# Patient Record
Sex: Female | Born: 2000
Health system: Southern US, Community
[De-identification: ages and names within clinical notes are randomized; demographics above are authoritative.]

---
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
## 2021-10-17 ENCOUNTER — Ambulatory Visit
Admission: EM | Admit: 2021-10-17 | Discharge: 2021-10-17 | Disposition: A | Discharge status: Home / Self Care | Payer: Medicaid Other | Attending: Family Medicine | Admitting: Family Medicine

## 2021-10-17 ENCOUNTER — Encounter: Payer: Self-pay | Admitting: Emergency Medicine

## 2021-10-17 DIAGNOSIS — Z20822 Contact with and (suspected) exposure to covid-19: Secondary | ICD-10-CM | POA: Insufficient documentation

## 2021-10-17 DIAGNOSIS — B349 Viral infection, unspecified: Secondary | ICD-10-CM | POA: Insufficient documentation

## 2021-10-17 DIAGNOSIS — R509 Fever, unspecified: Secondary | ICD-10-CM | POA: Diagnosis present

## 2021-10-17 LAB — POCT RAPID STREP A (OFFICE): Rapid Strep A Screen: NEGATIVE

## 2021-10-17 LAB — RESP PANEL BY RT-PCR (FLU A&B, COVID) ARPGX2
Influenza A by PCR: NEGATIVE
Influenza B by PCR: NEGATIVE
SARS Coronavirus 2 by RT PCR: NEGATIVE

## 2021-10-17 MED ORDER — ACETAMINOPHEN 325 MG PO TABS
650.0000 mg | ORAL_TABLET | Freq: Once | ORAL | Status: AC
  Administered 2021-10-17: 650 mg via ORAL

## 2021-10-17 NOTE — Discharge Instructions (Addendum)
Advised patient may take OTC Tylenol 1000 mg 1-3 times daily for fever.  Advised patient we will follow-up later this afternoon or first thing tomorrow morning with COVID-19/influenza results.  Advised patient if symptoms worsen and/or unresolved please follow-up with PCP or here for further evaluation.

## 2021-10-17 NOTE — ED Triage Notes (Signed)
Headache started on Monday at work  Nasal congestion  Denies fever Tylenol cold & flu  last night  - no relief  No home COVID test  Last COVID vaccine 2/23

## 2021-10-17 NOTE — ED Provider Notes (Signed)
[facility]    CSN: [id number] Arrival date & time: 10/17/21  1146      History   Chief Complaint Chief Complaint  Patient presents with   Sore Throat    HPI Jillian Jones is a 21 y.o. female.   HPI 21 year old female presents with fever, headache, nasal congestion, and sore throat for 2 days.  History reviewed. No pertinent past medical history.  There are no problems to display for this patient.   History reviewed. No pertinent surgical history.  OB History   No obstetric history on file.      Home Medications    Prior to Admission medications   Medication Sig Start Date End Date Taking? Authorizing Provider  fluticasone (FLONASE) 50 MCG/ACT nasal spray Place 2 sprays into both nostrils daily. Patient not taking: Reported on 10/17/2021 08/31/19   Palumbo, April, MD  naproxen (NAPROSYN) 375 MG tablet Take 1 tablet (375 mg total) by mouth 2 (two) times daily. Patient not taking: Reported on 10/17/2021 01/16/18   Petrucelli, Samantha R, PA-C  ondansetron (ZOFRAN ODT) 4 MG disintegrating tablet Take 1 tablet (4 mg total) by mouth every 8 (eight) hours as needed for up to 5 doses for nausea or vomiting. Patient not taking: Reported on 10/17/2021 10/28/20   Gloris Manchester, MD  promethazine (PHENERGAN) 12.5 MG tablet Take 1 tablet (12.5 mg total) by mouth every 6 (six) hours as needed for nausea or vomiting. Patient not taking: Reported on 10/17/2021 01/16/18   Petrucelli, Pleas Koch, PA-C    Family History Family History  Adopted: Yes  Family history unknown: Yes    Social History Social History   Tobacco Use   Smoking status: Never   Smokeless tobacco: Never  Vaping Use   Vaping Use: Never used  Substance Use Topics   Alcohol use: Never   Drug use: Never     Allergies   Patient has no known allergies.   Review of Systems Review of Systems  Constitutional:  Positive for fever.  HENT:  Positive for congestion and sore throat.   Neurological:   Positive for headaches.  All other systems reviewed and are negative.    Physical Exam Triage Vital Signs ED Triage Vitals  Enc Vitals Group     BP 10/17/21 1159 95/61     Pulse Rate 10/17/21 1159 66     Resp 10/17/21 1159 16     Temp 10/17/21 1159 100.2 F (37.9 C)     Temp Source 10/17/21 1159 Oral     SpO2 10/17/21 1159 98 %     Weight --      Height 10/17/21 1202 5\' 4"  (1.626 m)     Head Circumference --      Peak Flow --      Pain Score 10/17/21 1201 5     Pain Loc --      Pain Edu? --      Excl. in GC? --    No data found.  Updated Vital Signs BP 95/61 (BP Location: Left Arm)   Pulse 66   Temp 100.2 F (37.9 C) (Oral)   Resp 16   Ht 5\' 4"  (1.626 m)   LMP 09/30/2021 (Exact Date)   SpO2 98%   BMI 22.66 kg/m    Physical Exam Vitals and nursing note reviewed.  Constitutional:      Appearance: Normal appearance. She is well-developed and normal weight. She is ill-appearing.  HENT:     Head: Normocephalic and atraumatic.  Right Ear: Tympanic membrane, ear canal and external ear normal.     Left Ear: Tympanic membrane, ear canal and external ear normal.     Mouth/Throat:     Mouth: Mucous membranes are moist.     Pharynx: Oropharynx is clear. Uvula midline.  Eyes:     Conjunctiva/sclera: Conjunctivae normal.     Pupils: Pupils are equal, round, and reactive to light.  Cardiovascular:     Rate and Rhythm: Normal rate and regular rhythm.     Pulses: Normal pulses.     Heart sounds: Normal heart sounds. No murmur heard. Pulmonary:     Effort: Pulmonary effort is normal.     Breath sounds: Normal breath sounds. No wheezing, rhonchi or rales.  Musculoskeletal:     Cervical back: Normal range of motion and neck supple. No tenderness.  Lymphadenopathy:     Cervical: No cervical adenopathy.  Skin:    General: Skin is warm and dry.  Neurological:     General: No focal deficit present.     Mental Status: She is alert and oriented to person, place, and  time.      UC Treatments / Results  Labs (all labs ordered are listed, but only abnormal results are displayed) Labs Reviewed  RESP PANEL BY RT-PCR (FLU A&B, COVID) ARPGX2  CULTURE, GROUP A STREP [facility])  POCT RAPID STREP A (OFFICE)    EKG   Radiology No results found.  Procedures Procedures (including critical care time)  Medications Ordered in UC Medications  acetaminophen (TYLENOL) tablet 650 mg (650 mg Oral Given 10/17/21 1216)    Initial Impression / Assessment and Plan / UC Course  I have reviewed the triage vital signs and the nursing notes.  Pertinent labs & imaging results that were available during my care of the patient were reviewed by me and considered in my medical decision making (see chart for details).     MDM: 1.  Fever-Tylenol 650 mg given once in clinic today; 2.  Viral illness-COVID-19/Influenza ordered. Advised patient may take OTC Tylenol 1000 mg 1-3 times daily for fever.  Advised patient we will follow-up later this afternoon or first thing tomorrow morning with COVID-19/influenza results.  Advised patient if symptoms worsen and/or unresolved please follow-up with PCP or here for further evaluation.  Work note provided to patient prior to discharge.  Patient discharged home, hemodynamically stable. Final Clinical Impressions(s) / UC Diagnoses   Final diagnoses:  Fever, unspecified  Viral illness     Discharge Instructions      Advised patient may take OTC Tylenol 1000 mg 1-3 times daily for fever.  Advised patient we will follow-up later this afternoon or first thing tomorrow morning with COVID-19/influenza results.  Advised patient if symptoms worsen and/or unresolved please follow-up with PCP or here for further evaluation.     ED Prescriptions   None    PDMP not reviewed this encounter.   Trevor Iha, FNP 10/17/21 1252

## 2021-10-18 ENCOUNTER — Telehealth: Payer: Self-pay | Admitting: Emergency Medicine

## 2021-10-18 NOTE — Telephone Encounter (Signed)
Spoke with patient states that her throat isn't bothering her anymore but she is still congested.  Will follow up as needed.

## 2021-10-21 LAB — CULTURE, GROUP A STREP (THRC)

## 2022-01-25 ENCOUNTER — Ambulatory Visit
Admission: EM | Admit: 2022-01-25 | Discharge: 2022-01-25 | Disposition: A | Discharge status: Home / Self Care | Payer: Medicaid Other | Attending: Family Medicine | Admitting: Family Medicine

## 2022-01-25 ENCOUNTER — Encounter: Payer: Self-pay | Admitting: Emergency Medicine

## 2022-01-25 DIAGNOSIS — M791 Myalgia, unspecified site: Secondary | ICD-10-CM | POA: Insufficient documentation

## 2022-01-25 DIAGNOSIS — Z1152 Encounter for screening for COVID-19: Secondary | ICD-10-CM | POA: Diagnosis not present

## 2022-01-25 DIAGNOSIS — J029 Acute pharyngitis, unspecified: Secondary | ICD-10-CM | POA: Diagnosis not present

## 2022-01-25 LAB — RESP PANEL BY RT-PCR (FLU A&B, COVID) ARPGX2
Influenza A by PCR: NEGATIVE
Influenza B by PCR: NEGATIVE
SARS Coronavirus 2 by RT PCR: NEGATIVE

## 2022-01-25 LAB — POCT RAPID STREP A (OFFICE): Rapid Strep A Screen: NEGATIVE

## 2022-01-25 MED ORDER — ACETAMINOPHEN 325 MG PO TABS
650.0000 mg | ORAL_TABLET | Freq: Once | ORAL | Status: AC
  Administered 2022-01-25: 650 mg via ORAL

## 2022-01-25 NOTE — Discharge Instructions (Signed)
Try warm salt water gargles for sore throat.  May take Ibuprofen 200mg , 4 tabs every 8 hours with food for sore throat  If cold-like symptoms develop, try the following: Take plain guaifenesin (1200mg  extended release tabs such as Mucinex) twice daily, with plenty of water, for cough and congestion.  May add Pseudoephedrine (30mg , one or two every 4 to 6 hours) for sinus congestion.  Get adequate rest.   May use Afrin nasal spray (or generic oxymetazoline) each morning for about 5 days and then discontinue.  Also recommend using saline nasal spray several times daily and saline nasal irrigation (AYR is a common brand).  Use Flonase nasal spray each morning after using Afrin nasal spray and saline nasal irrigation. May take Delsym Cough Suppressant ("12 Hour Cough Relief") at bedtime for nighttime cough.  Stop all antihistamines for now, and other non-prescription cough/cold preparations.

## 2022-01-25 NOTE — ED Provider Notes (Signed)
[facility]    CSN: [id number] Arrival date & time: 01/25/22  0816      History   Chief Complaint Chief Complaint  Patient presents with   Sore Throat    HPI Jillian Jones is a 21 y.o. female.   Three days ago patient developed sore throat, myalgias, mild fatigue and post nasal drainage with minimal nasal congestion.  She denies cough and does not believe that she has had fever.  The history is provided by the patient.    History reviewed. No pertinent past medical history.  There are no problems to display for this patient.   History reviewed. No pertinent surgical history.  OB History   No obstetric history on file.      Home Medications    Prior to Admission medications   Medication Sig Start Date End Date Taking? Authorizing Provider  fluticasone (FLONASE) 50 MCG/ACT nasal spray Place 2 sprays into both nostrils daily. Patient not taking: Reported on 10/17/2021 08/31/19   Palumbo, April, MD  naproxen (NAPROSYN) 375 MG tablet Take 1 tablet (375 mg total) by mouth 2 (two) times daily. Patient not taking: Reported on 10/17/2021 01/16/18   Petrucelli, Samantha R, PA-C  ondansetron (ZOFRAN ODT) 4 MG disintegrating tablet Take 1 tablet (4 mg total) by mouth every 8 (eight) hours as needed for up to 5 doses for nausea or vomiting. Patient not taking: Reported on 10/17/2021 10/28/20   Gloris Manchester, MD  promethazine (PHENERGAN) 12.5 MG tablet Take 1 tablet (12.5 mg total) by mouth every 6 (six) hours as needed for nausea or vomiting. Patient not taking: Reported on 10/17/2021 01/16/18   Petrucelli, Pleas Koch, PA-C    Family History Family History  Adopted: Yes  Family history unknown: Yes    Social History Social History   Tobacco Use   Smoking status: Never   Smokeless tobacco: Never  Vaping Use   Vaping Use: Never used  Substance Use Topics   Alcohol use: Never   Drug use: Never     Allergies   Patient has no known allergies.   Review of  Systems Review of Systems + sore throat No cough No pleuritic pain No wheezing ? nasal congestion + post-nasal drainage No sinus pain/pressure No itchy/red eyes No earache No hemoptysis No SOB ? Fever  No nausea No vomiting No abdominal pain No diarrhea No urinary symptoms No skin rash + fatigue + myalgias No Headache Used OTC meds without relief  Physical Exam Triage Vital Signs ED Triage Vitals  Enc Vitals Group     BP 01/25/22 0824 97/63     Pulse Rate 01/25/22 0824 81     Resp 01/25/22 0824 14     Temp 01/25/22 0824 99.3 F (37.4 C)     Temp Source 01/25/22 0824 Oral     SpO2 01/25/22 0824 98 %     Weight 01/25/22 0826 127 lb (57.6 kg)     Height 01/25/22 0826 5\' 4"  (1.626 m)     Head Circumference --      Peak Flow --      Pain Score 01/25/22 0825 5     Pain Loc --      Pain Edu? --      Excl. in GC? --    No data found.  Updated Vital Signs BP 97/63 (BP Location: Right Arm)   Pulse 81   Temp 99.3 F (37.4 C) (Oral)   Resp 14   Ht 5\' 4"  (1.626  m)   Wt 57.6 kg   LMP 01/11/2022 (Approximate)   SpO2 98%   BMI 21.80 kg/m   Visual Acuity Right Eye Distance:   Left Eye Distance:   Bilateral Distance:    Right Eye Near:   Left Eye Near:    Bilateral Near:     Physical Exam Nursing notes and Vital Signs reviewed. Appearance:  Patient appears stated age, and in no acute distress Eyes:  Pupils are equal, round, and reactive to light and accomodation.  Extraocular movement is intact.  Conjunctivae are not inflamed  Ears:  Canals normal.  Tympanic membranes normal.  Nose:  Mildly congested turbinates.  No sinus tenderness.  Pharynx:  Normal Neck:  Supple.  Mildly enlarged lateral nodes are present, tender to palpation on the left.   Lungs:  Clear to auscultation.  Breath sounds are equal.  Moving air well. Heart:  Regular rate and rhythm without murmurs, rubs, or gallops.  Abdomen:  Nontender without masses or hepatosplenomegaly.  Bowel sounds  are present.  No CVA or flank tenderness.  Extremities:  No edema.  Skin:  No rash present.   UC Treatments / Results  Labs (all labs ordered are listed, but only abnormal results are displayed) Labs Reviewed  CULTURE, GROUP A STREP ([place])  RESP PANEL BY RT-PCR (FLU A&B, COVID) ARPGX2  POCT RAPID STREP A (OFFICE) negative    EKG   Radiology No results found.  Procedures Procedures (including critical care time)  Medications Ordered in UC Medications  acetaminophen (TYLENOL) tablet 650 mg (650 mg Oral Given 01/25/22 0836)    Initial Impression / Assessment and Plan / UC Course  I have reviewed the triage vital signs and the nursing notes.  Pertinent labs & imaging results that were available during my care of the patient were reviewed by me and considered in my medical decision making (see chart for details).    Suspect developing viral URI. Throat culture and COVID/FLU pending. Followup with Family Doctor if not improved in 10 days.  Final Clinical Impressions(s) / UC Diagnoses   Final diagnoses:  Acute pharyngitis, unspecified etiology  Pharyngitis, unspecified etiology     Discharge Instructions      Try warm salt water gargles for sore throat.  May take Ibuprofen 200mg , 4 tabs every 8 hours with food for sore throat  If cold-like symptoms develop, try the following: Take plain guaifenesin (1200mg  extended release tabs such as Mucinex) twice daily, with plenty of water, for cough and congestion.  May add Pseudoephedrine (30mg , one or two every 4 to 6 hours) for sinus congestion.  Get adequate rest.   May use Afrin nasal spray (or generic oxymetazoline) each morning for about 5 days and then discontinue.  Also recommend using saline nasal spray several times daily and saline nasal irrigation (AYR is a common brand).  Use Flonase nasal spray each morning after using Afrin nasal spray and saline nasal irrigation. May take Delsym Cough Suppressant ("12 Hour Cough  Relief") at bedtime for nighttime cough.  Stop all antihistamines for now, and other non-prescription cough/cold preparations.      ED Prescriptions   None       Kandra Nicolas, MD 01/27/22 2104

## 2022-01-25 NOTE — ED Triage Notes (Signed)
Sore throat w/ body aches since Tuesday  Denies fever  OTC tylenol cold & flu on Tuesday & tylenol PM on Wednesday am  No meds since Wed

## 2022-01-28 LAB — CULTURE, GROUP A STREP (THRC)

## 2022-05-23 ENCOUNTER — Ambulatory Visit
Admission: EM | Admit: 2022-05-23 | Discharge: 2022-05-23 | Disposition: A | Discharge status: Home / Self Care | Payer: Medicaid Other | Attending: Family Medicine | Admitting: Family Medicine

## 2022-05-23 DIAGNOSIS — J111 Influenza due to unidentified influenza virus with other respiratory manifestations: Secondary | ICD-10-CM

## 2022-05-23 LAB — POC SARS CORONAVIRUS 2 AG -  ED: SARS Coronavirus 2 Ag: NEGATIVE

## 2022-05-23 LAB — POCT INFLUENZA A/B
Influenza A, POC: NEGATIVE
Influenza B, POC: POSITIVE — AB

## 2022-05-23 MED ORDER — OSELTAMIVIR PHOSPHATE 75 MG PO CAPS: 75.0000 mg | ORAL_CAPSULE | Freq: Two times a day (BID) | ORAL | 0 refills | Status: DC

## 2022-05-23 NOTE — ED Provider Notes (Signed)
[name]    CSN: [id number] Arrival date & time: 05/23/22  1439      History   Chief Complaint Chief Complaint  Patient presents with   Nasal Congestion   eye problem   Cough   Sore Throat    HPI Jillian Jones is a 22 y.o. female.   HPI  Patient has been sick for 3 days, today is the fourth.  She has nasal congestion, postnasal drip, mild cough.  Headache and bodyaches.  Sore throat.  She states that her left eye hurts when she looks around.  Vision is intact.  No eye drainage.  She does feel tired and states she has bodyaches.   no known exposure to illness  History reviewed. No pertinent past medical history.  There are no problems to display for this patient.   History reviewed. No pertinent surgical history.  OB History   No obstetric history on file.      Home Medications    Prior to Admission medications   Medication Sig Start Date End Date Taking? Authorizing Provider  DM-Doxylamine-Acetaminophen (NYQUIL COLD & FLU PO) Take by mouth.   Yes [provider]    Family History Family History  Adopted: Yes  Problem Relation Age of Onset   Healthy Mother    Healthy Father     Social History Social History   Tobacco Use   Smoking status: Never   Smokeless tobacco: Never  Vaping Use   Vaping Use: Never used  Substance Use Topics   Alcohol use: Yes    Comment: occ   Drug use: Never     Allergies   Patient has no known allergies.   Review of Systems Review of Systems See HPI  Physical Exam Triage Vital Signs ED Triage Vitals  Enc Vitals Group     BP 05/23/22 1453 100/69     Pulse Rate 05/23/22 1453 90     Resp 05/23/22 1453 20     Temp 05/23/22 1453 98.8 F (37.1 C)     Temp Source 05/23/22 1453 Oral     SpO2 05/23/22 1453 97 %     Weight 05/23/22 1449 135 lb (61.2 kg)     Height 05/23/22 1449 5\' 4"  (1.626 m)     Head Circumference --      Peak Flow --      Pain Score 05/23/22 1449 5     Pain Loc --       Pain Edu? --      Excl. in [place]? --    No data found.  Updated Vital Signs BP 100/69 (BP Location: Right Arm)   Pulse 90   Temp 98.8 F (37.1 C) (Oral)   Resp 20   Ht 5\' 4"  (1.626 m)   Wt 61.2 kg   LMP 05/04/2022 (Approximate)   SpO2 97%   BMI 23.17 kg/m      Physical Exam Constitutional:      General: She is not in acute distress.    Appearance: She is well-developed. She is ill-appearing.  HENT:     Head: Normocephalic and atraumatic.     Right Ear: Tympanic membrane and ear canal normal.     Left Ear: Tympanic membrane and ear canal normal.     Nose: Congestion and rhinorrhea present.     Mouth/Throat:     Mouth: Mucous membranes are moist.     Pharynx: Posterior oropharyngeal erythema present.     Tonsils: No tonsillar exudate.  1+ on the right. 1+ on the left.  Eyes:     Conjunctiva/sclera: Conjunctivae normal.     Pupils: Pupils are equal, round, and reactive to light.  Cardiovascular:     Rate and Rhythm: Normal rate and regular rhythm.  Pulmonary:     Effort: Pulmonary effort is normal. No respiratory distress.     Breath sounds: Normal breath sounds.  Abdominal:     General: There is no distension.     Palpations: Abdomen is soft.  Musculoskeletal:        General: Normal range of motion.     Cervical back: Normal range of motion.  Lymphadenopathy:     Cervical: Cervical adenopathy present.  Skin:    General: Skin is warm and dry.  Neurological:     Mental Status: She is alert.      UC Treatments / Results  Labs   Influenza positive Rapid COVID-negative   Medications Ordered in UC Medications - No data to display  Initial Impression / Assessment and Plan / UC Course  I have reviewed the triage vital signs and the nursing notes.  Pertinent labs & imaging results that were available during my care of the patient were reviewed by me and considered in my medical decision making (see chart for details).     Discussed Tamiflu treatment.   Prevention.  Home treatment. Final Clinical Impressions(s) / UC Diagnoses   Final diagnoses:  Influenza     Discharge Instructions      You have influenza.  Take the Tamiflu 2 times a day for 5 days.  May take over-the-counter cough and cold medicines.  Take Tylenol or ibuprofen for pain and fever.  Make sure you are drinking lots of water.  Call for problems.  Return to work when your fever has been gone for 24 hours and you have improvement in symptoms     ED Prescriptions   None    PDMP not reviewed this encounter.   Raylene Everts, MD 05/23/22 [id number]

## 2022-05-23 NOTE — ED Triage Notes (Addendum)
Pt presents to Urgent Care with c/o nasal congestion, sore throat, L eye soreness, cough, and intermittent body aches x 3 days. Has not measured temperature or done a COVID test.

## 2022-05-23 NOTE — Discharge Instructions (Signed)
You have influenza.  Take the Tamiflu 2 times a day for 5 days.  May take over-the-counter cough and cold medicines.  Take Tylenol or ibuprofen for pain and fever.  Make sure you are drinking lots of water.  Call for problems.  Return to work when your fever has been gone for 24 hours and you have improvement in symptoms

## 2022-07-14 ENCOUNTER — Encounter (HOSPITAL_BASED_OUTPATIENT_CLINIC_OR_DEPARTMENT_OTHER): Payer: Self-pay | Admitting: Emergency Medicine

## 2022-07-14 ENCOUNTER — Emergency Department (HOSPITAL_BASED_OUTPATIENT_CLINIC_OR_DEPARTMENT_OTHER)
Admission: EM | Admit: 2022-07-14 | Discharge: 2022-07-14 | Disposition: A | Discharge status: Home / Self Care | Payer: Medicaid Other | Attending: Emergency Medicine | Admitting: Emergency Medicine

## 2022-07-14 ENCOUNTER — Emergency Department (HOSPITAL_BASED_OUTPATIENT_CLINIC_OR_DEPARTMENT_OTHER): Payer: Medicaid Other

## 2022-07-14 DIAGNOSIS — M79605 Pain in left leg: Secondary | ICD-10-CM | POA: Diagnosis present

## 2022-07-14 DIAGNOSIS — R202 Paresthesia of skin: Secondary | ICD-10-CM | POA: Insufficient documentation

## 2022-07-14 DIAGNOSIS — W2209XA Striking against other stationary object, initial encounter: Secondary | ICD-10-CM | POA: Diagnosis not present

## 2022-07-14 NOTE — ED Provider Notes (Signed)
[facility] Provider Note   CSN: [id number] Arrival date & time: 07/14/22  1928     History  Chief Complaint  Patient presents with   Leg Pain    Jillian Jones is a 22 y.o. female who presents to ED complaining of left foot paresthesias and left calf tenderness. Symptoms started one week ago when she hit her foot on a poll. Patient states the pain feels like pins and needles on the bottom of her left foot. Also migratory pain that feels "shooting" that initially presented in thigh, then knee, and now left calf.  Denies Tobacco use, birthcontrol use, recent travel, recent surgery, history of blood clots, foot numbness/weakness, urinary retention, saddle paresthesias.    Leg Pain      Home Medications Prior to Admission medications   Medication Sig Start Date End Date Taking? Authorizing Provider  DM-Doxylamine-Acetaminophen (NYQUIL COLD & FLU PO) Take by mouth.    [provider]  oseltamivir (TAMIFLU) 75 MG capsule Take 1 capsule (75 mg total) by mouth every 12 (twelve) hours. 05/23/22   Eustace Moore, MD      Allergies    Patient has no known allergies.    Review of Systems   Review of Systems  Musculoskeletal:        Foot pain    Physical Exam Updated Vital Signs BP 113/72 (BP Location: Right Arm)   Pulse 80   Temp 98.1 F (36.7 C)   Resp 18   Ht 5\' 4"  (1.626 m)   Wt 59.4 kg   LMP 06/28/2022   SpO2 99%   BMI 22.49 kg/m  Physical Exam Vitals and nursing note reviewed.  Constitutional:      General: She is not in acute distress.    Appearance: She is not ill-appearing or toxic-appearing.  HENT:     Head: Normocephalic and atraumatic.  Eyes:     General: No scleral icterus.       Right eye: No discharge.        Left eye: No discharge.     Conjunctiva/sclera: Conjunctivae normal.  Cardiovascular:     Rate and Rhythm: Normal rate and regular rhythm.     Pulses: Normal pulses.     Heart  sounds: Normal heart sounds.  Pulmonary:     Effort: Pulmonary effort is normal.     Breath sounds: Normal breath sounds.  Abdominal:     General: Abdomen is flat.  Musculoskeletal:     Comments: Active toe and ankle ROM unrestricted and without pain. Strength intact. Feet are neurovascularly intact bilaterally. +2 pedal pulses without numbness. Pain to palpation of navicular region. Left calf tenderness to palpation. No swelling, erythema, warmth. Skin is non-tense.  Skin:    General: Skin is warm and dry.     Capillary Refill: Capillary refill takes less than 2 seconds.  Neurological:     General: No focal deficit present.     Mental Status: She is alert. Mental status is at baseline.  Psychiatric:        Mood and Affect: Mood normal.        Behavior: Behavior normal.     ED Results / Procedures / Treatments   Labs (all labs ordered are listed, but only abnormal results are displayed) Labs Reviewed - No data to display  EKG None  Radiology US Venous Img Lower  Left (DVT Study)  Result Date: 07/14/2022 CLINICAL DATA:  Left foot pain status post  trauma 1 week ago. EXAM: LEFT LOWER EXTREMITY VENOUS DOPPLER ULTRASOUND TECHNIQUE: Gray-scale sonography with compression, as well as color and duplex ultrasound, were performed to evaluate the deep venous system(s) from the level of the common femoral vein through the popliteal and proximal calf veins. COMPARISON:  None Available. FINDINGS: VENOUS Normal compressibility of the common femoral, superficial femoral, and popliteal veins, as well as the visualized calf veins. Visualized portions of profunda femoral vein and great saphenous vein unremarkable. No filling defects to suggest DVT on grayscale or color Doppler imaging. Doppler waveforms show normal direction of venous flow, normal respiratory plasticity and response to augmentation. Limited views of the contralateral common femoral vein are unremarkable. OTHER None. Limitations: none  IMPRESSION: Negative. Electronically Signed   By: Aram Candela M.D.   On: 07/14/2022 20:24   DG Foot Complete Left  Result Date: 07/14/2022 CLINICAL DATA:  Foot pain and paresthesias. EXAM: LEFT FOOT - COMPLETE 3+ VIEW COMPARISON:  None Available. FINDINGS: There is no evidence of fracture or dislocation. There is no evidence of arthropathy or other focal bone abnormality. Soft tissues are unremarkable. IMPRESSION: Negative. Electronically Signed   By: Emmaline Kluver M.D.   On: 07/14/2022 20:06    Procedures Procedures    Medications Ordered in ED Medications - No data to display  ED Course/ Medical Decision Making/ A&P                             Medical Decision Making Amount and/or Complexity of Data Reviewed Radiology: ordered.   This patient presents to the ED for concern of left foot and calf pain, this involves an extensive number of treatment options, and is a complaint that carries with it a high risk of complications and morbidity.  The differential diagnosis includes hemarthrosis, gout, septic joint, fracture, tendonitis, carpal tunnel syndrome, muscle strain, bursitis, compartment syndrome   Co morbidities that complicate the patient evaluation  none   Imaging Studies ordered:  I ordered imaging studies including -Left foot xray: no concern for fracture or dislocation -doppler US left leg (DVT): negative I independently visualized and interpreted imaging Shared findings with patient I agree with the radiologist interpretation   Problem List / ED Course / Critical interventions / Medication management  Patient presents to ED concerned of left foot pain since hitting it on a poll last week. States that she has to walk on heel to prevent pain during ambulation. Physical exam unremarkable except for tenderness to palpation of navicular area of left foot. Foot xray without concern for fractures. [place] without concern of blood clot. Toes with excellent capillary  refill. No skin changes or lacerations. Patient without history of herniated disc/back pain/back trauma - less concerning for radiculopathy. I am concerned that patient hit a superficial nerve on her foot last week and is suffering symptoms related to the injured nerve. I educated patient that symptoms might take a couple more weeks to resolve, and she should follow up with primary care to reassess pain next week.  Patient with stable vitals and ready for discharge. Provided patient with return precautions. Patient without questions or concerns at this time. I have reviewed the patients home medicines and have made adjustments as needed   Ddx these are considered less likely due to history of present illness and physical exam -hemarthrosis: joint without swelling; ROM intact -gout: no warmth or erythema; ROM intact  -septic joint: afebrile; no warmth or erythema; no skin  changes; ROM intact  -fracture: xray without concern  -compartment syndrome: area not tense; neurovascularly intact   Social Determinants of Health:  none          Final Clinical Impression(s) / ED Diagnoses Final diagnoses:  Left leg pain    Rx / DC Orders ED Discharge Orders     None         Margarita Rana 07/14/22 2226    Alvira Monday, MD 07/15/22 1307

## 2022-07-14 NOTE — ED Triage Notes (Signed)
Pt reports pain/numbness/tingling that has progressed from upper thigh to foot, now only in calf and foot; sts she hit LT ankle on a pole 1 wk ago; amb w/ a limp

## 2022-07-14 NOTE — ED Notes (Signed)
Ultrasound at bedside

## 2022-07-14 NOTE — Discharge Instructions (Signed)
It was a pleasure caring for you today. Xray without concern of fracture. Ultrasound without concern for blood clot. I recommend following up with primary care if symptoms do not start to resolve within the next week.  Seek emergency care if experiencing new or worsening symptoms such as change/increased pain, foot turning blue, leg weakness.

## 2022-07-14 NOTE — ED Notes (Signed)
Left leg and foot numbness, pain and tingling  Circulation intact

## 2023-02-22 ENCOUNTER — Ambulatory Visit
Admission: EM | Admit: 2023-02-22 | Discharge: 2023-02-22 | Disposition: A | Discharge status: Home / Self Care | Payer: Medicaid Other | Attending: Family Medicine | Admitting: Family Medicine

## 2023-02-22 DIAGNOSIS — Z3490 Encounter for supervision of normal pregnancy, unspecified, unspecified trimester: Secondary | ICD-10-CM | POA: Diagnosis not present

## 2023-02-22 DIAGNOSIS — R519 Headache, unspecified: Secondary | ICD-10-CM | POA: Diagnosis not present

## 2023-02-22 DIAGNOSIS — R110 Nausea: Secondary | ICD-10-CM

## 2023-02-22 LAB — POCT URINE PREGNANCY: Preg Test, Ur: POSITIVE — AB

## 2023-02-22 NOTE — Discharge Instructions (Signed)
You need to take your prenatal vitamin Eat well, avoid alcohol and tobacco Do not take any medication without the advice of a doctor.  You may take Tylenol for your headache Try saltines and sips of water for the nausea

## 2023-02-22 NOTE — ED Provider Notes (Signed)
[facility]    CSN: [id number] Arrival date & time: 02/22/23  1405      History   Chief Complaint Chief Complaint  Patient presents with   Nausea    Headache and nausea x4 days    HPI Jillian Jones is a 22 y.o. female.   Patient is here requesting a pregnancy test.  She did a pregnancy test at home that was positive.  She has had some nausea, decreased appetite, and a headache for the last couple of days.  No abdominal pain.  No vaginal bleeding    History reviewed. No pertinent past medical history.  There are no active problems to display for this patient.   History reviewed. No pertinent surgical history.  OB History   No obstetric history on file.      Home Medications    Prior to Admission medications   Not on File    Family History Family History  Adopted: Yes  Problem Relation Age of Onset   Healthy Mother    Healthy Father     Social History Social History   Tobacco Use   Smoking status: Never   Smokeless tobacco: Never  Vaping Use   Vaping status: Never Used  Substance Use Topics   Alcohol use: Not Currently    Comment: occ   Drug use: Never     Allergies   Patient has no known allergies.   Review of Systems Review of Systems  See HPI Physical Exam Triage Vital Signs ED Triage Vitals  Encounter Vitals Group     BP 02/22/23 1428 99/60     Systolic BP Percentile --      Diastolic BP Percentile --      Pulse Rate 02/22/23 1428 68     Resp 02/22/23 1428 19     Temp 02/22/23 1428 98.3 F (36.8 C)     Temp Source 02/22/23 1428 Oral     SpO2 02/22/23 1428 97 %     Weight 02/22/23 1427 135 lb (61.2 kg)     Height 02/22/23 1427 5\' 4"  (1.626 m)     Head Circumference --      Peak Flow --      Pain Score 02/22/23 1427 6     Pain Loc --      Pain Education --      Exclude from Growth Chart --    No data found.  Updated Vital Signs BP 99/60 (BP Location: Right Arm)   Pulse 68   Temp 98.3 F (36.8 C) (Oral)    Resp 19   Ht 5\' 4"  (1.626 m)   Wt 61.2 kg   LMP 01/15/2023   SpO2 97%   BMI 23.17 kg/m       Physical Exam Constitutional:      General: She is not in acute distress.    Appearance: She is well-developed and normal weight.  HENT:     Head: Normocephalic and atraumatic.  Eyes:     Conjunctiva/sclera: Conjunctivae normal.     Pupils: Pupils are equal, round, and reactive to light.  Cardiovascular:     Rate and Rhythm: Normal rate.  Pulmonary:     Effort: Pulmonary effort is normal. No respiratory distress.  Abdominal:     General: There is no distension.     Palpations: Abdomen is soft.  Musculoskeletal:        General: Normal range of motion.     Cervical back: Normal range of motion.  Skin:    General: Skin is warm and dry.  Neurological:     Mental Status: She is alert.      UC Treatments / Results  Labs (all labs ordered are listed, but only abnormal results are displayed) Labs Reviewed  POCT URINE PREGNANCY - Abnormal; Notable for the following components:      Result Value   Preg Test, Ur Positive (*)    All other components within normal limits    EKG   Radiology No results found.  Procedures Procedures (including critical care time)  Medications Ordered in UC Medications - No data to display  Initial Impression / Assessment and Plan / UC Course  I have reviewed the triage vital signs and the nursing notes.  Pertinent labs & imaging results that were available during my care of the patient were reviewed by me and considered in my medical decision making (see chart for details).     Advised patient her pregnancy test is positive.  Recommend against drugs and alcohol.  Recommend well-balanced diet and prenatal vitamin. Final Clinical Impressions(s) / UC Diagnoses   Final diagnoses:  Nausea  Early stage of pregnancy  Acute nonintractable headache, unspecified headache type     Discharge Instructions      You need to take your  prenatal vitamin Eat well, avoid alcohol and tobacco Do not take any medication without the advice of a doctor.  You may take Tylenol for your headache Try saltines and sips of water for the nausea    ED Prescriptions   None    PDMP not reviewed this encounter.   Eustace Moore, MD 02/22/23 [phone]

## 2023-02-22 NOTE — ED Triage Notes (Signed)
Pt states that she has a headache and nausea x4 days

## 2023-02-27 ENCOUNTER — Telehealth: Payer: Self-pay

## 2023-02-27 ENCOUNTER — Telehealth: Payer: Self-pay | Admitting: *Deleted

## 2023-02-27 NOTE — Telephone Encounter (Signed)
Left patient an urgent message with both appointment details.

## 2023-02-27 NOTE — Telephone Encounter (Signed)
Patient called for vomiting d/t pregnancy. Dr. Delton See informed, sts for patient to go to [facility] hospital for evaluation if she is vomiting profusely. Patient notified.

## 2023-03-02 ENCOUNTER — Emergency Department (HOSPITAL_BASED_OUTPATIENT_CLINIC_OR_DEPARTMENT_OTHER): Payer: Medicaid Other

## 2023-03-02 ENCOUNTER — Emergency Department (HOSPITAL_BASED_OUTPATIENT_CLINIC_OR_DEPARTMENT_OTHER)
Admission: EM | Admit: 2023-03-02 | Discharge: 2023-03-02 | Disposition: A | Discharge status: Home / Self Care | Payer: Medicaid Other

## 2023-03-02 ENCOUNTER — Encounter (HOSPITAL_BASED_OUTPATIENT_CLINIC_OR_DEPARTMENT_OTHER): Payer: Self-pay

## 2023-03-02 ENCOUNTER — Other Ambulatory Visit: Payer: Self-pay

## 2023-03-02 DIAGNOSIS — O2311 Infections of bladder in pregnancy, first trimester: Secondary | ICD-10-CM | POA: Insufficient documentation

## 2023-03-02 DIAGNOSIS — R8271 Bacteriuria: Secondary | ICD-10-CM | POA: Diagnosis not present

## 2023-03-02 DIAGNOSIS — Z3A01 Less than 8 weeks gestation of pregnancy: Secondary | ICD-10-CM | POA: Insufficient documentation

## 2023-03-02 DIAGNOSIS — O219 Vomiting of pregnancy, unspecified: Secondary | ICD-10-CM | POA: Diagnosis present

## 2023-03-02 LAB — COMPREHENSIVE METABOLIC PANEL
ALT: 12 U/L (ref 0–44)
AST: 19 U/L (ref 15–41)
Albumin: 4.4 g/dL (ref 3.5–5.0)
Alkaline Phosphatase: 52 U/L (ref 38–126)
Anion gap: 9 (ref 5–15)
BUN: 8 mg/dL (ref 6–20)
CO2: 25 mmol/L (ref 22–32)
Calcium: 9.7 mg/dL (ref 8.9–10.3)
Chloride: 102 mmol/L (ref 98–111)
Creatinine, Ser: 0.6 mg/dL (ref 0.44–1.00)
GFR, Estimated: >60 mL/min (ref >60)
Glucose, Bld: 83 mg/dL (ref 70–99)
Potassium: 4 mmol/L (ref 3.5–5.1)
Sodium: 136 mmol/L (ref 135–145)
Total Bilirubin: 0.6 mg/dL (ref <1.2)
Total Protein: 7.9 g/dL (ref 6.5–8.1)

## 2023-03-02 LAB — CBC
HCT: 40.6 % (ref 36.0–46.0)
Hemoglobin: 13.5 g/dL (ref 12.0–15.0)
MCH: 29 pg (ref 26.0–34.0)
MCHC: 33.3 g/dL (ref 30.0–36.0)
MCV: 87.3 fL (ref 80.0–100.0)
Platelets: 244 10*3/uL (ref 150–400)
RBC: 4.65 MIL/uL (ref 3.87–5.11)
RDW: 12.3 % (ref 11.5–15.5)
WBC: 6.7 10*3/uL (ref 4.0–10.5)
nRBC: 0 % (ref 0.0–0.2)

## 2023-03-02 LAB — URINALYSIS, MICROSCOPIC (REFLEX)

## 2023-03-02 LAB — URINALYSIS, ROUTINE W REFLEX MICROSCOPIC
Glucose, UA: NEGATIVE mg/dL
Ketones, ur: 80 mg/dL — AB
Leukocytes,Ua: NEGATIVE
Nitrite: NEGATIVE
Protein, ur: 100 mg/dL — AB
Specific Gravity, Urine: >=1.03 (ref 1.005–1.030)
pH: 6 (ref 5.0–8.0)

## 2023-03-02 LAB — LIPASE, BLOOD: Lipase: 40 U/L (ref 11–51)

## 2023-03-02 LAB — HCG, QUANTITATIVE, PREGNANCY: hCG, Beta Chain, Quant, S: 76496 m[IU]/mL — ABNORMAL HIGH (ref <5)

## 2023-03-02 MED ORDER — SODIUM CHLORIDE 0.9 % IV BOLUS
1000.0000 mL | Freq: Once | INTRAVENOUS | Status: AC
  Administered 2023-03-02: 1000 mL via INTRAVENOUS

## 2023-03-02 MED ORDER — ONDANSETRON HCL 4 MG/2ML IJ SOLN
INTRAMUSCULAR | Status: AC
  Filled 2023-03-02: qty 2

## 2023-03-02 MED ORDER — CEPHALEXIN 500 MG PO CAPS: 500.0000 mg | ORAL_CAPSULE | Freq: Three times a day (TID) | ORAL | 0 refills | Status: AC

## 2023-03-02 MED ORDER — ONDANSETRON 4 MG PO TBDP: 4.0000 mg | ORAL_TABLET | Freq: Three times a day (TID) | ORAL | 0 refills | Status: DC | PRN

## 2023-03-02 MED ORDER — ONDANSETRON HCL 4 MG/2ML IJ SOLN
4.0000 mg | Freq: Once | INTRAMUSCULAR | Status: AC
  Administered 2023-03-02: 4 mg via INTRAVENOUS

## 2023-03-02 NOTE — ED Triage Notes (Signed)
States took first dose of abortion pill on Friday. Started having some bleeding this morning. Continues to have vomiting, unable to keep anything down. States dark urine, is dehydrated.   [redacted] weeks pregnant

## 2023-03-02 NOTE — ED Notes (Signed)
IV zofran order verbally placed by cooper PA

## 2023-03-02 NOTE — Discharge Instructions (Signed)
It was a pleasure taking care of you here in the emergency department  I have written you for some Zofran, take as prescribed  Your urine did have some bacteria, we are treating this with antibiotics  If you continue to have symptoms follow-up with immaturity and admissions unit at [facility].  This is entrance C.  There is free valet parking in front.  This is NOT to the emergency department entrance however you would enter by labor and delivery.  They have OB/GYN so there that can help you with your symptoms should they progress  Return for new or worsening symptoms

## 2023-03-02 NOTE — ED Provider Notes (Signed)
[facility] Provider Note   CSN: [id number] Arrival date & time: 03/02/23  1246     History  Chief Complaint  Patient presents with   Emesis    Jillian Jones is a 22 y.o. female here for evaluation nausea and vomiting.  LMP 01/15/2023.  Approximately [redacted] weeks pregnant by dates.  Seen by urgent care on 02/22/2023 for vomiting in pregnancy.  States this was an unwanted pregnancy she subsequently took "the abortion pill" on Friday.  Is continue to have emesis.  States she started having some vaginal bleeding today which she describes as "spotting."  She has no abdominal pain.  She has not had an ultrasound.  States has not seen in person however retrieved medications online.  Supposed to take second pill today however due to emesis she has not.  States she has vomited since she took her first pregnancy test this is not changed.  No dysuria hematuria.  No flank pain, abdominal pain.  She reached out to [facility] who told her to be seen in the [facility]  HPI     Home Medications Prior to Admission medications   Medication Sig Start Date End Date Taking? Authorizing Provider  cephALEXin (KEFLEX) 500 MG capsule Take 1 capsule (500 mg total) by mouth 3 (three) times daily for 5 days. 03/02/23 03/07/23 Yes Emmit Oriley A, PA-C  ondansetron (ZOFRAN-ODT) 4 MG disintegrating tablet Take 1 tablet (4 mg total) by mouth every 8 (eight) hours as needed. 03/02/23  Yes Duke Weisensel A, PA-C      Allergies    Patient has no known allergies.    Review of Systems   Review of Systems  Constitutional: Negative.   HENT: Negative.    Respiratory: Negative.    Cardiovascular: Negative.   Gastrointestinal:  Positive for nausea and vomiting. Negative for abdominal distention, abdominal pain, anal bleeding, blood in stool, constipation, diarrhea and rectal pain.  Genitourinary:  Positive for vaginal bleeding.  Musculoskeletal: Negative.   Skin:  Negative.   Neurological: Negative.   All other systems reviewed and are negative.   Physical Exam Updated Vital Signs BP 116/82   Pulse 62   Temp 98.3 F (36.8 C) (Oral)   Resp 16   Ht 5\' 4"  (1.626 m)   Wt 57.6 kg   LMP 01/15/2023   SpO2 100%   BMI 21.80 kg/m  Physical Exam Vitals and nursing note reviewed.  Constitutional:      General: She is not in acute distress.    Appearance: She is well-developed. She is not ill-appearing, toxic-appearing or diaphoretic.  HENT:     Head: Atraumatic.  Eyes:     Pupils: Pupils are equal, round, and reactive to light.  Cardiovascular:     Rate and Rhythm: Normal rate.     Pulses: Normal pulses.     Heart sounds: Normal heart sounds.  Pulmonary:     Effort: Pulmonary effort is normal. No respiratory distress.     Breath sounds: Normal breath sounds.  Abdominal:     General: Bowel sounds are normal. There is no distension.     Palpations: Abdomen is soft. There is no mass.     Tenderness: There is no abdominal tenderness. There is no right CVA tenderness or guarding.     Hernia: No hernia is present.  Musculoskeletal:        General: Normal range of motion.     Cervical back: Normal range of motion.  Skin:    General: Skin is warm and dry.  Neurological:     General: No focal deficit present.     Mental Status: She is alert.  Psychiatric:        Mood and Affect: Mood normal.     ED Results / Procedures / Treatments   Labs (all labs ordered are listed, but only abnormal results are displayed) Labs Reviewed  URINALYSIS, ROUTINE W REFLEX MICROSCOPIC - Abnormal; Notable for the following components:      Result Value   APPearance CLOUDY (*)    Hgb urine dipstick LARGE (*)    Bilirubin Urine SMALL (*)    Ketones, ur 80 (*)    Protein, ur 100 (*)    All other components within normal limits  HCG, QUANTITATIVE, PREGNANCY - Abnormal; Notable for the following components:   hCG, Beta Chain, Quant, S 76,496 (*)    All other  components within normal limits  URINALYSIS, MICROSCOPIC (REFLEX) - Abnormal; Notable for the following components:   Bacteria, UA MANY (*)    All other components within normal limits  LIPASE, BLOOD  COMPREHENSIVE METABOLIC PANEL  CBC    EKG None  Radiology US OB LESS THAN 14 WEEKS WITH OB TRANSVAGINAL Result Date: 03/02/2023 CLINICAL DATA:  86105 Emesis 86105 EXAM: OBSTETRIC <14 WK [place] AND TRANSVAGINAL OB US TECHNIQUE: Both transabdominal and transvaginal ultrasound examinations were performed for complete evaluation of the gestation as well as the maternal uterus, adnexal regions, and pelvic cul-de-sac. Transvaginal technique was performed to assess early pregnancy. COMPARISON:  None Available. FINDINGS: Intrauterine gestational sac: Single Yolk sac:  Visualized. Embryo:  Visualized. Cardiac Activity: Visualized. Heart Rate: 99 bpm MSD: 21.5 mm   7 w   1 d CRL:  4.5 mm   6 w   1 d                  [place] EDC: 10/24/2023 Subchorionic hemorrhage:  None visualized. Maternal uterus/adnexae: Corpus luteal cyst noted in the left ovary. Otherwise unremarkable bilateral ovaries. IMPRESSION: *Single live intrauterine gestation with embryonic bradycardia. Electronically Signed   By: Jules Schick M.D.   On: 03/02/2023 15:34    Procedures Procedures    Medications Ordered in ED Medications  ondansetron (ZOFRAN) injection 4 mg (4 mg Intravenous Given 03/02/23 1339)  sodium chloride 0.9 % bolus 1,000 mL (0 mLs Intravenous Stopped 03/02/23 1545)    ED Course/ Medical Decision Making/ A&P   22 year old approximately [redacted] weeks pregnant by dates for evaluation of persistent nausea and vomiting.  This has been occurring since she found out she is pregnant about 2 weeks ago.  No meds PTA.  This is an unwanted pregnancy she received "abortion pills" via the online platform.  She took 1 dose on Friday.  She started spotting today.  She has not had a formal ultrasound for this pregnancy.  She was supposed to  take the second pill today however due to emesis did not.  She comes here today she is afebrile, nonseptic, non-ill-appearing.  She is normotensive.  She received Zofran in triage which has improved her symptoms.  Her abdomen is soft, nontender.  She denies any large bleeding or clotting.  Will plan on checking labs, ultrasound to make sure this was not an ectopic pregnancy and reassess  Labs and imaging personally viewed and interpreted: CBC without leukocytosis, hemoglobin 13.5 Metabolic panel without significant abnormality Quant hCG 76496 UA with blood, many bacteria, 21-50 WBC Ultrasound shows IUP with  fetal bradycardia Lipase 40  Patient reassessed.  Tolerating p.o. intake  We discussed her labs and imaging.  Prescription written for Zofran.  Will also treat for bacteriuria.  Will have her follow-up at [facility] if her symptoms worsen, I encouraged her to follow-up to establish care with OB/GYN.  She will return for new or worsening symptoms.  The patient has been appropriately medically screened and/or stabilized in the ED. I have low suspicion for any other emergent medical condition which would require further screening, evaluation or treatment in the ED or require inpatient management.  Patient is hemodynamically stable and in no acute distress.  Patient able to ambulate in department prior to ED.  Evaluation does not show acute pathology that would require ongoing or additional emergent interventions while in the emergency department or further inpatient treatment.  I have discussed the diagnosis with the patient and answered all questions.  Pain is been managed while in the emergency department and patient has no further complaints prior to discharge.  Patient is comfortable with plan discussed in room and is stable for discharge at this time.  I have discussed strict return precautions for returning to the emergency department.  Patient was encouraged to follow-up with PCP/specialist refer to  at discharge.                                Medical Decision Making Amount and/or Complexity of Data Reviewed External Data Reviewed: labs, radiology and notes. Labs: ordered. Decision-making details documented in ED Course. Radiology: ordered and independent interpretation performed. Decision-making details documented in ED Course.  Risk OTC drugs. Prescription drug management. Parenteral controlled substances. Decision regarding hospitalization. Diagnosis or treatment significantly limited by social determinants of health.           Final Clinical Impression(s) / ED Diagnoses Final diagnoses:  Nausea and vomiting in pregnancy  Bacteriuria during pregnancy    Rx / DC Orders ED Discharge Orders          Ordered    ondansetron (ZOFRAN-ODT) 4 MG disintegrating tablet  Every 8 hours PRN        03/02/23 1619    cephALEXin (KEFLEX) 500 MG capsule  3 times daily        03/02/23 1619              Saleha Kalp A, PA-C 03/02/23 1659    Coral Spikes, DO 03/03/23 [phone]

## 2023-03-02 NOTE — ED Notes (Signed)
Pt given water for PO challenge 

## 2023-03-11 ENCOUNTER — Encounter: Payer: Medicaid Other | Admitting: Obstetrics and Gynecology

## 2023-03-31 ENCOUNTER — Encounter: Payer: Medicaid Other | Admitting: Obstetrics and Gynecology

## 2023-03-31 ENCOUNTER — Telehealth: Payer: Self-pay | Admitting: *Deleted

## 2023-03-31 NOTE — Telephone Encounter (Signed)
Left patient an urgent message to call and reschedule appointment due to Dr. Para March not being in the office at time of appointment due to surgery/OR delay.

## 2023-08-31 ENCOUNTER — Emergency Department (HOSPITAL_BASED_OUTPATIENT_CLINIC_OR_DEPARTMENT_OTHER)

## 2023-08-31 ENCOUNTER — Emergency Department (HOSPITAL_BASED_OUTPATIENT_CLINIC_OR_DEPARTMENT_OTHER)
Admission: EM | Admit: 2023-08-31 | Discharge: 2023-08-31 | Disposition: A | Discharge status: Home / Self Care | Attending: Emergency Medicine | Admitting: Emergency Medicine

## 2023-08-31 ENCOUNTER — Encounter (HOSPITAL_BASED_OUTPATIENT_CLINIC_OR_DEPARTMENT_OTHER): Payer: Self-pay

## 2023-08-31 DIAGNOSIS — Y92410 Unspecified street and highway as the place of occurrence of the external cause: Secondary | ICD-10-CM | POA: Insufficient documentation

## 2023-08-31 DIAGNOSIS — S199XXA Unspecified injury of neck, initial encounter: Secondary | ICD-10-CM | POA: Diagnosis present

## 2023-08-31 DIAGNOSIS — S0990XA Unspecified injury of head, initial encounter: Secondary | ICD-10-CM | POA: Insufficient documentation

## 2023-08-31 DIAGNOSIS — S161XXA Strain of muscle, fascia and tendon at neck level, initial encounter: Secondary | ICD-10-CM | POA: Insufficient documentation

## 2023-08-31 MED ORDER — METHOCARBAMOL 500 MG PO TABS: 500.0000 mg | ORAL_TABLET | Freq: Three times a day (TID) | ORAL | 0 refills | Status: DC | PRN

## 2023-08-31 MED ORDER — IBUPROFEN 800 MG PO TABS: 800.0000 mg | ORAL_TABLET | Freq: Three times a day (TID) | ORAL | 0 refills | Status: DC | PRN
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
## 2023-08-31 NOTE — Discharge Instructions (Signed)
 You were seen in the Emergency Department (ED) today for a head injury.  Based on your evaluation, you may have sustained a concussion (or bruise) to your brain.  If you had a CT scan done, it did not show any evidence of serious injury or bleeding.    Symptoms to expect from a concussion include nausea, mild to moderate headache, difficulty concentrating or sleeping, and mild lightheadedness.  These symptoms should improve over the next few days to weeks, but it may take many weeks before you feel back to normal.  Return to the emergency department or follow-up with your primary care doctor if your symptoms are not improving over this time.  Signs of a more serious head injury include vomiting, severe headache, excessive sleepiness or confusion, and weakness or numbness in your face, arms or legs.  Return immediately to the Emergency Department if you experience any of these more concerning symptoms.    Rest, avoid strenuous physical or mental activity, and avoid activities that could potentially result in another head injury until all your symptoms from this head injury are completely resolved for at least 2-3 weeks.  If you participate in sports, get cleared by your doctor or trainer before returning to play.  You may take ibuprofen or acetaminophen over the counter according to label instructions for mild headache or scalp soreness.
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
## 2023-11-20 ENCOUNTER — Ambulatory Visit
Admission: EM | Admit: 2023-11-20 | Discharge: 2023-11-20 | Disposition: A | Discharge status: Home / Self Care | Attending: Family Medicine | Admitting: Family Medicine

## 2023-11-20 ENCOUNTER — Other Ambulatory Visit: Payer: Self-pay

## 2023-11-20 DIAGNOSIS — J039 Acute tonsillitis, unspecified: Secondary | ICD-10-CM | POA: Diagnosis present

## 2023-11-20 LAB — POCT RAPID STREP A (OFFICE): Rapid Strep A Screen: NEGATIVE

## 2023-11-20 MED ORDER — DEXAMETHASONE 6 MG PO TABS
10.0000 mg | ORAL_TABLET | Freq: Once | ORAL | Status: AC
  Administered 2023-11-20: 10 mg via ORAL

## 2023-11-20 MED ORDER — AMOXICILLIN 875 MG PO TABS: 875.0000 mg | ORAL_TABLET | Freq: Two times a day (BID) | ORAL | 0 refills | Status: AC

## 2023-11-20 NOTE — ED Provider Notes (Signed)
 Jillian Jones    CSN: [id number] Arrival date & time: 11/20/23  1533      History   Chief Complaint Chief Complaint  Patient presents with   Sore Throat    HPI Jillian Jones is a 23 y.o. female.   HPI  Started with a mild sore throat yesterday.  Sore throat has gotten much worse over the last day and a half.  Now she has a very painful sore throat, can hardly swallow, muffled voice, thick mucus in back of throat.  She looked at her tonsils and they are quite large.  She has not been around anyone who is sick with strep, COVID, flu.  She does have a low-grade fever.  History reviewed. No pertinent past medical history.  There are no active problems to display for this patient.   History reviewed. No pertinent surgical history.  OB History   No obstetric history on file.      Home Medications    Prior to Admission medications   Medication Sig Start Date End Date Taking? Authorizing Provider  amoxicillin  (AMOXIL ) 875 MG tablet Take 1 tablet (875 mg total) by mouth 2 (two) times daily. 11/20/23  Yes Maranda Jamee Jacob, MD    Family History Family History  Adopted: Yes  Problem Relation Age of Onset   Healthy Mother    Healthy Father     Social History Social History   Tobacco Use   Smoking status: Never   Smokeless tobacco: Never  Vaping Use   Vaping status: Never Used  Substance Use Topics   Alcohol use: Not Currently    Comment: occ   Drug use: Never     Allergies   Patient has no known allergies.   Review of Systems Review of Systems  See HPI Physical Exam Triage Vital Signs ED Triage Vitals  Encounter Vitals Group     BP 11/20/23 1539 111/69     Girls Systolic BP Percentile --      Girls Diastolic BP Percentile --      Boys Systolic BP Percentile --      Boys Diastolic BP Percentile --      Pulse Rate 11/20/23 1539 86     Resp 11/20/23 1539 16     Temp 11/20/23 1539 99 F (37.2 C)     Temp src --      SpO2 11/20/23 1539  98 %     Weight --      Height --      Head Circumference --      Peak Flow --      Pain Score 11/20/23 1542 7     Pain Loc --      Pain Education --      Exclude from Growth Chart --    No data found.  Updated Vital Signs BP 111/69   Pulse 86   Temp 99 F (37.2 C)   Resp 16   LMP 11/03/2023 (Approximate)   SpO2 98%   Visual Acuity Right Eye Distance:   Left Eye Distance:   Bilateral Distance:    Right Eye Near:   Left Eye Near:    Bilateral Near:     Physical Exam Constitutional:      General: She is not in acute distress.    Appearance: She is well-developed.  HENT:     Head: Normocephalic and atraumatic.     Mouth/Throat:     Mouth: Mucous membranes are moist.  Pharynx: Uvula midline. Pharyngeal swelling and posterior oropharyngeal erythema present.     Tonsils: No tonsillar exudate. 3+ on the right. 3+ on the left.  Eyes:     Conjunctiva/sclera: Conjunctivae normal.     Pupils: Pupils are equal, round, and reactive to light.  Cardiovascular:     Rate and Rhythm: Normal rate.  Pulmonary:     Effort: Pulmonary effort is normal. No respiratory distress.  Abdominal:     General: There is no distension.     Palpations: Abdomen is soft.  Musculoskeletal:        General: Normal range of motion.     Cervical back: Normal range of motion.  Lymphadenopathy:     Cervical: Cervical adenopathy present.  Skin:    General: Skin is warm and dry.  Neurological:     Mental Status: She is alert.      UC Treatments / Results  Labs (all labs ordered are listed, but only abnormal results are displayed) Labs Reviewed  CULTURE, GROUP A STREP [facility])  POCT RAPID STREP A (OFFICE)    EKG   Radiology No results found.  Procedures Procedures (including critical Jones time)  Medications Ordered in UC Medications  dexamethasone  (DECADRON ) tablet 10 mg (has no administration in time range)    Initial Impression / Assessment and Plan / UC Course  I have  reviewed the triage vital signs and the nursing notes.  Pertinent labs & imaging results that were available during my Jones of the patient were reviewed by me and considered in my medical decision making (see chart for details).     Patient will check MyChart for her test result.  She knows she can stop the antibiotic early if her strep culture is negative Final Clinical Impressions(s) / UC Diagnoses   Final diagnoses:  Exudative tonsillitis     Discharge Instructions      I am giving you 1 dose of Decadron  (steroid) to reduce the tonsil pain and swelling Take the amoxicillin  2 times a day for a week You may stop early if your throat culture does not show strep throat Check MyChart for your test result Call for problems   ED Prescriptions     Medication Sig Dispense Auth. Provider   amoxicillin  (AMOXIL ) 875 MG tablet Take 1 tablet (875 mg total) by mouth 2 (two) times daily. 14 tablet Maranda Jamee Jacob, MD      PDMP not reviewed this encounter.   Maranda Jamee Jacob, MD 11/20/23 [phone]

## 2023-11-20 NOTE — ED Triage Notes (Signed)
 Sore throat x 2 days. Has hot potato voice per pt. Reports she coughed up thick mucus that had blood in it. No fever. No otc meds.

## 2023-11-20 NOTE — Discharge Instructions (Signed)
 I am giving you 1 dose of Decadron  (steroid) to reduce the tonsil pain and swelling Take the amoxicillin  2 times a day for a week You may stop early if your throat culture does not show strep throat Check MyChart for your test result Call for problems

## 2023-11-22 LAB — CULTURE, GROUP A STREP (THRC)
